# Patient Record
Sex: Female | Born: 2000 | Hispanic: Yes | Marital: Single | Smoking: Former smoker
Health system: Southern US, Community
[De-identification: ages and names within clinical notes are randomized; demographics above are authoritative.]

## PROBLEM LIST (undated history)

## (undated) ENCOUNTER — Inpatient Hospital Stay (HOSPITAL_COMMUNITY): Payer: Self-pay

## (undated) DIAGNOSIS — O26649 Intrahepatic cholestasis of pregnancy, unspecified trimester: Secondary | ICD-10-CM

## (undated) DIAGNOSIS — O26619 Liver and biliary tract disorders in pregnancy, unspecified trimester: Secondary | ICD-10-CM

## (undated) DIAGNOSIS — K831 Obstruction of bile duct: Secondary | ICD-10-CM

## (undated) HISTORY — DX: Intrahepatic cholestasis of pregnancy, unspecified trimester: O26.649

## (undated) HISTORY — PX: NO PAST SURGERIES: SHX2092

## (undated) HISTORY — DX: Liver and biliary tract disorders in pregnancy, unspecified trimester: K83.1

## (undated) HISTORY — DX: Liver and biliary tract disorders in pregnancy, unspecified trimester: O26.619

---
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
## 2021-11-22 IMAGING — US US MFM FETAL BPP W/O NON-STRESS
1 series · 15 of 22 positions shown · non-contrast
Comparison: none

[Series 1: us mfm fetal bpp w/o non-stress · 22 acquisitions, 15 frames shown]
[im 1/22]
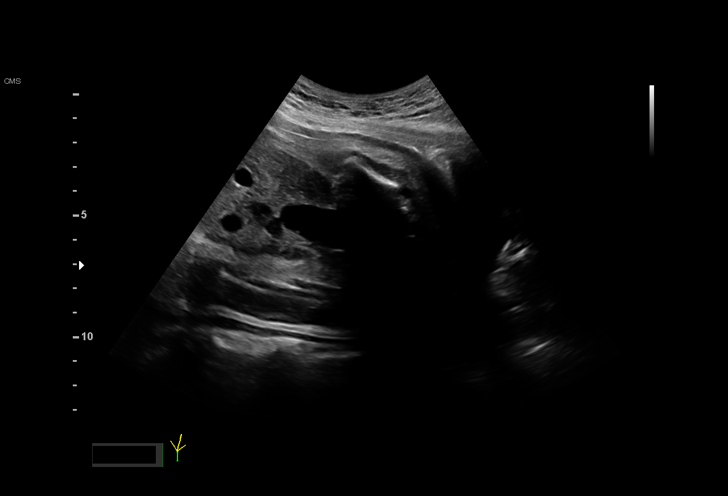
[im 3/22]
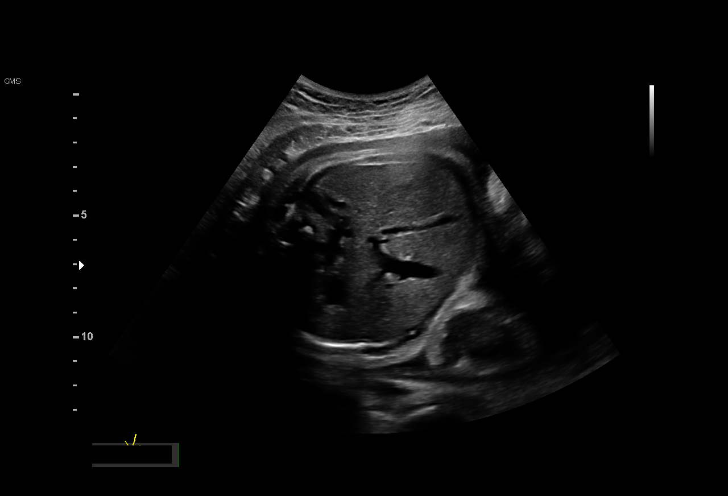
[im 4/22]
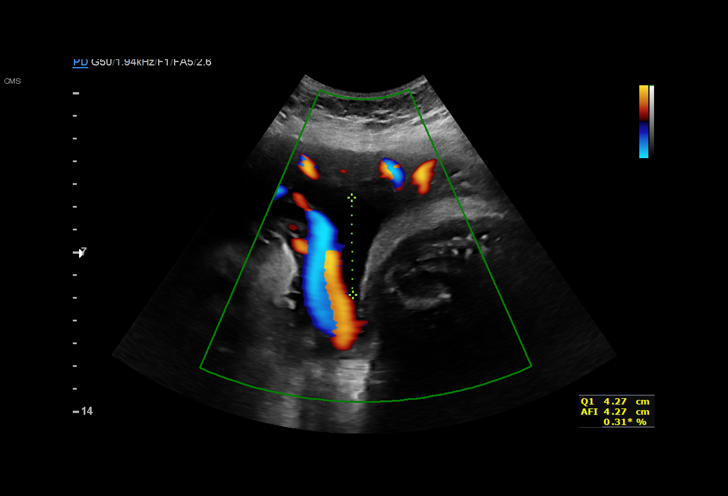
[im 6/22]
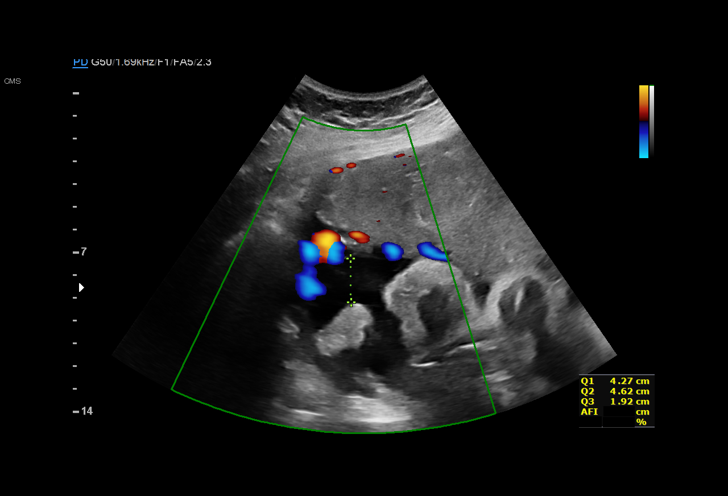
[im 7/22]
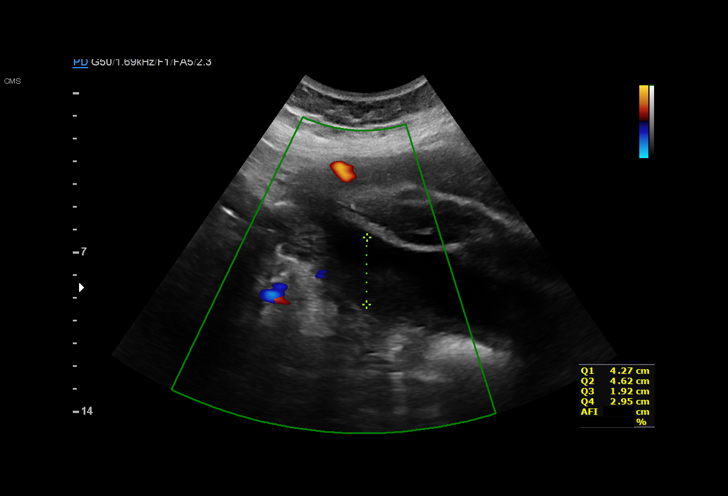
[im 9/22]
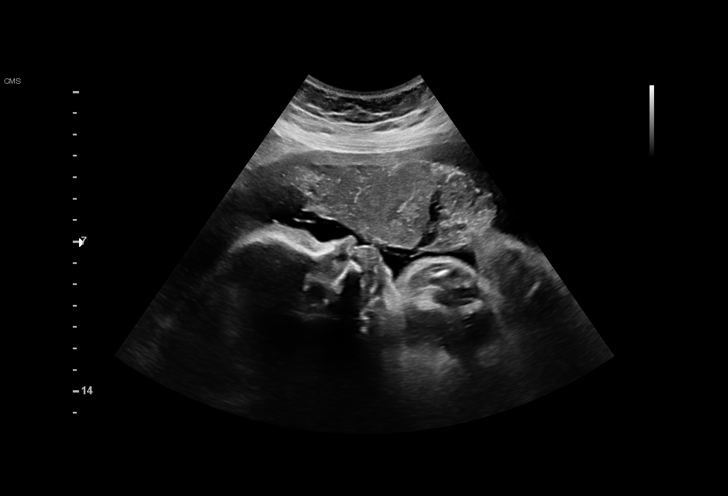
[im 10/22]
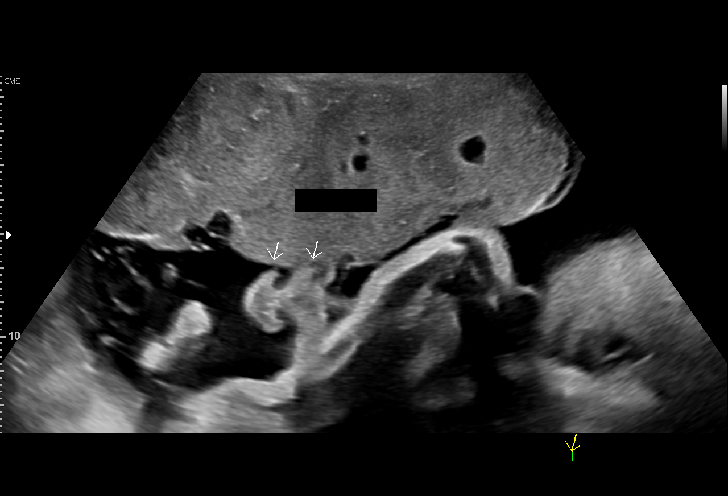
[im 12/22]
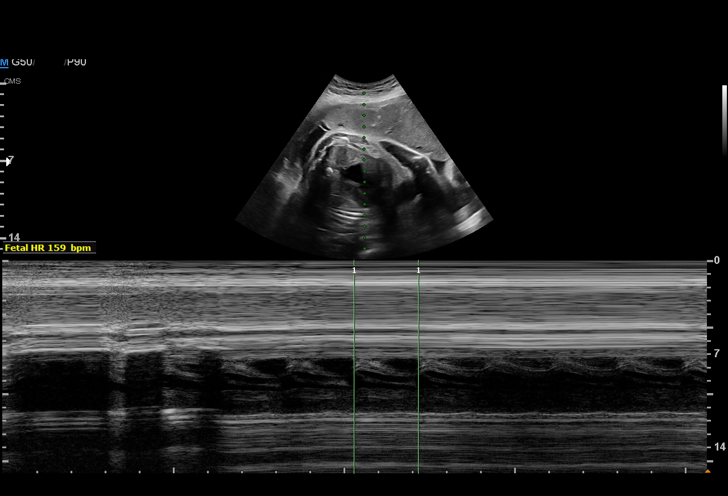
[im 13/22]
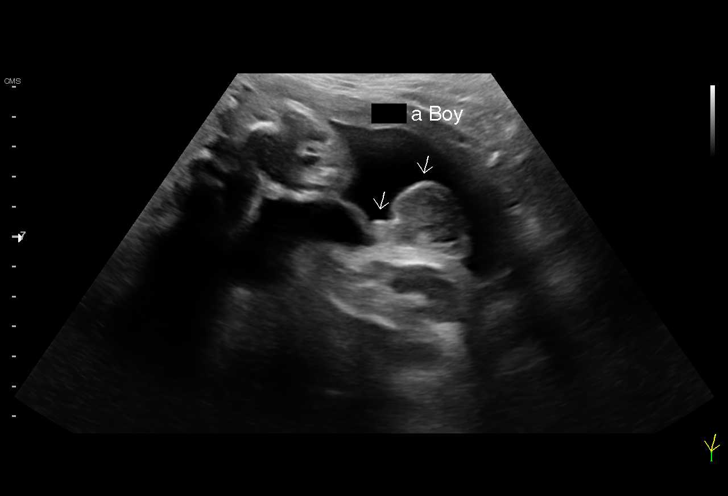
[im 14/22]
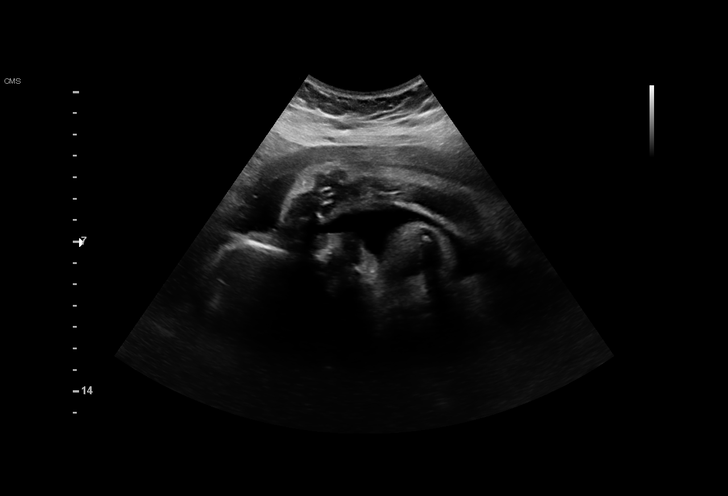
[im 16/22]
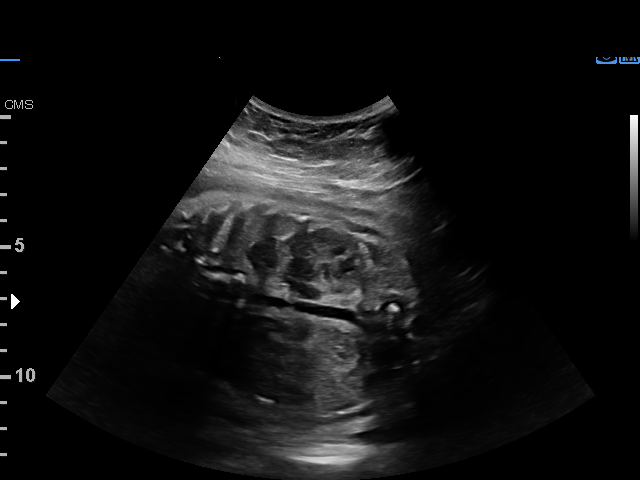
[im 17/22]
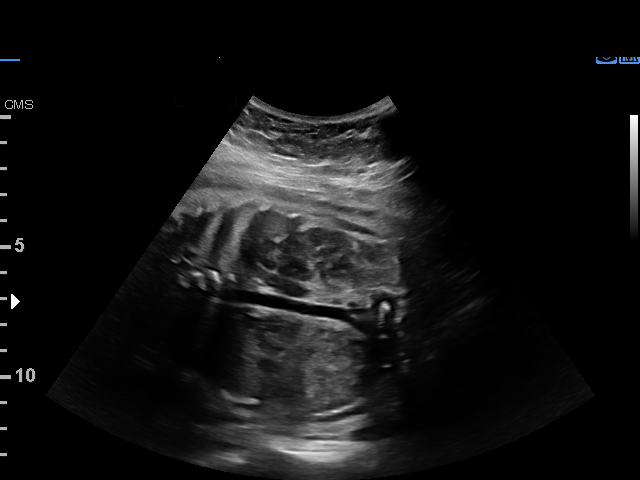
[im 19/22]
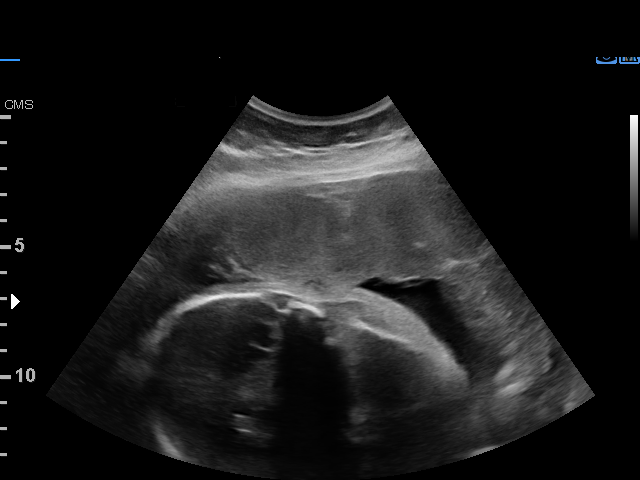
[im 20/22]
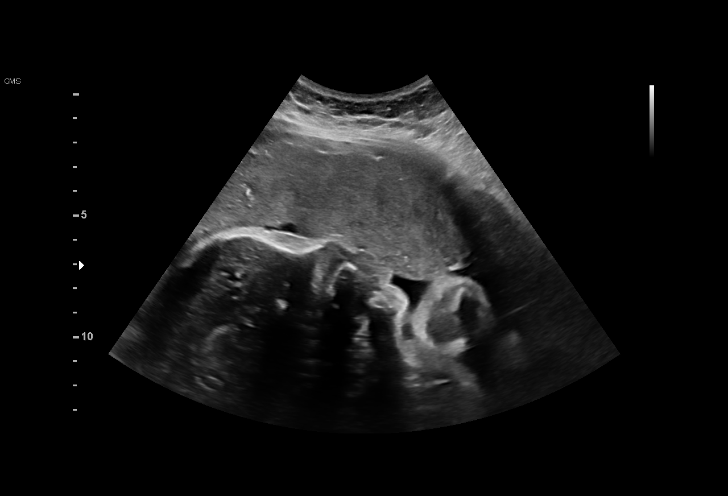
[im 22/22]
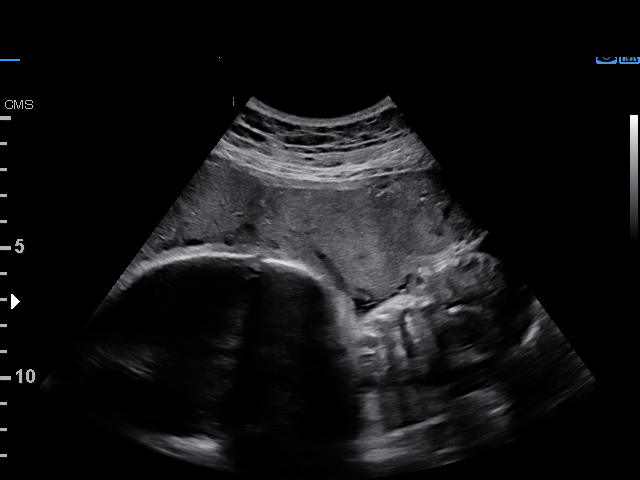

[15 of 22 positions shown; findings below may reference images not displayed]

----------------------------------------------------------------------

 ----------------------------------------------------------------------
Indications

  Cholestasis of pregnancy, third trimester      [id number].5
  Encounter for other antenatal screening
  follow-up
  33 weeks gestation of pregnancy
 ----------------------------------------------------------------------
Fetal Evaluation

 Num Of Fetuses:          1
 Fetal Heart Rate(bpm):   159
 Cardiac Activity:        Observed
 Presentation:            Breech

 Amniotic Fluid
 AFI FV:      Within normal limits

 AFI Sum(cm)     %Tile       Largest Pocket(cm)
 13.76           46

 RUQ(cm)       RLQ(cm)       LUQ(cm)        LLQ(cm)

Biophysical Evaluation

 Amniotic F.V:   Within normal limits       F. Tone:         Observed
 F. Movement:    Observed                   Score:           [DATE]
 F. Breathing:   Observed
OB History
 Blood Type:    O+
 Gravidity:    1         Term:   0        Prem:   0        SAB:   0
 TOP:          0       Ectopic:  0        Living: 0
Gestational Age

 LMP:           33w 0d        Date:  02/02/19                 EDD:   11/09/19
 Best:          33w 0d     Det. By:  LMP  (02/02/19)          EDD:   11/09/19
Impression

 Biophysical profile for known cholestasis in pregnancy
 good fetal movement and amniotic fluid
Recommendations

 Continue weekly testing
 Repeat growth 3-4 weeks
 Delivery by 37 weeks.

## 2021-12-19 IMAGING — US US MFM FETAL BPP W/O NON-STRESS
1 series · 15 of 28 positions shown · non-contrast
Comparison: none

[Series 1: us mfm fetal bpp w/o non-stress · 30 acquisitions, 15 frames shown]
[im 1/30]
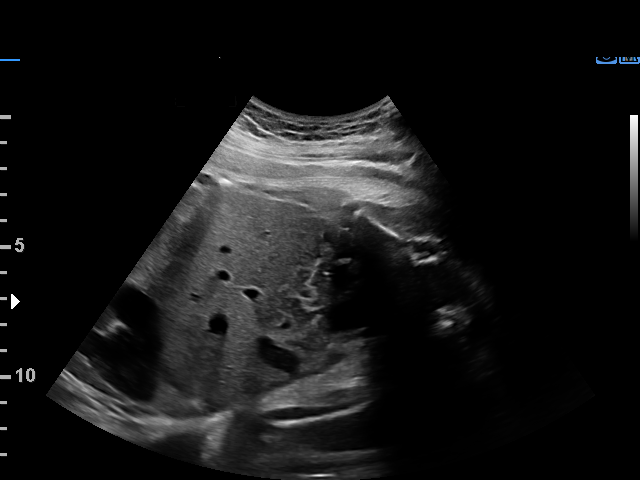
[im 3/30]
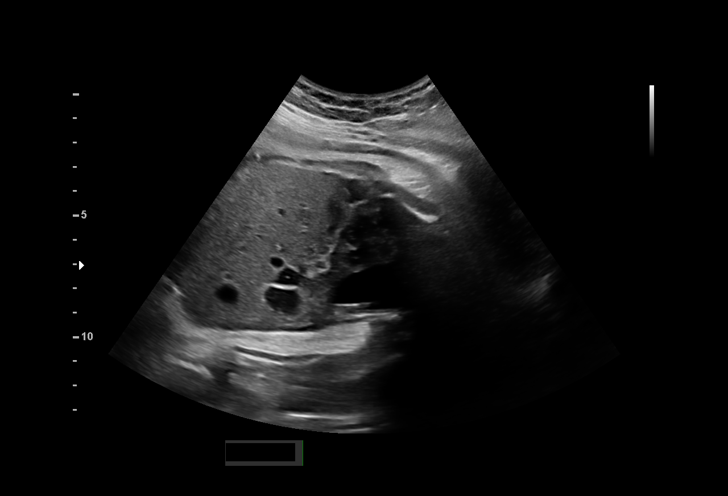
[im 5/30]
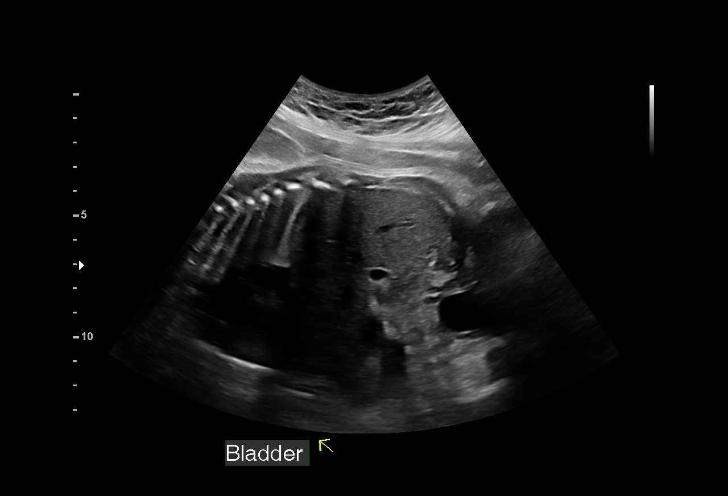
[im 7/30]
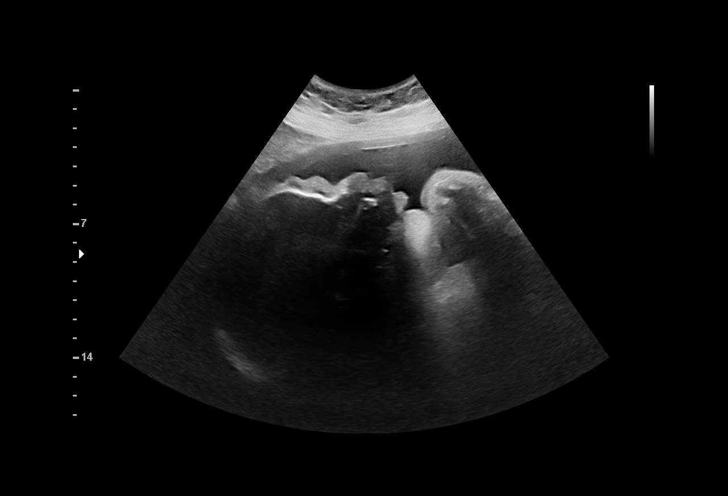
[im 9/30]
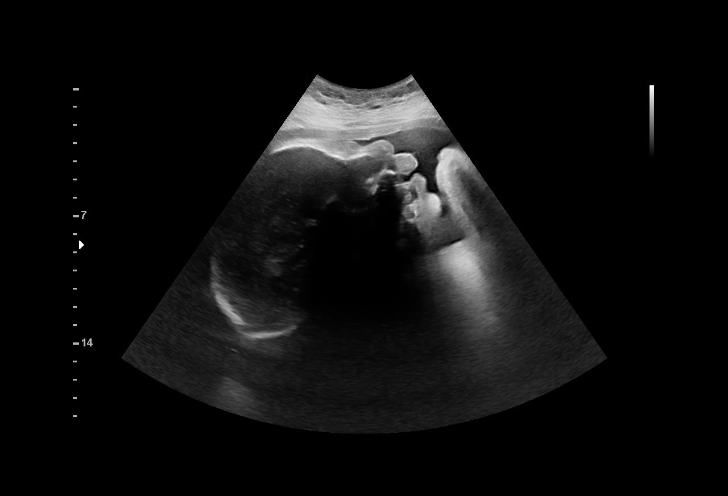
[im 11/30]
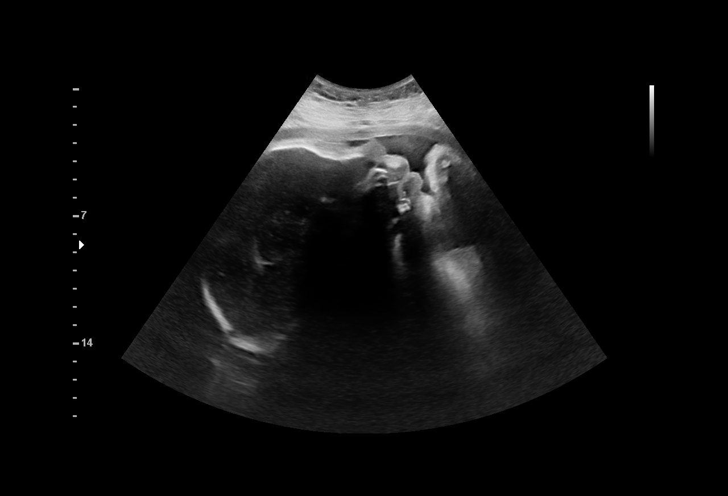
[im 13/30]
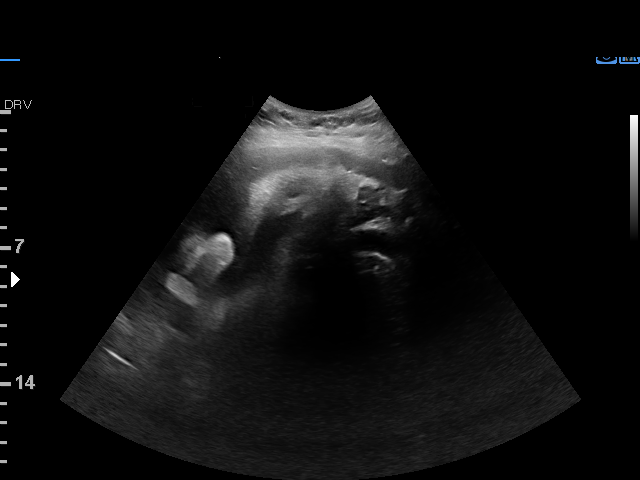
[im 16/30]
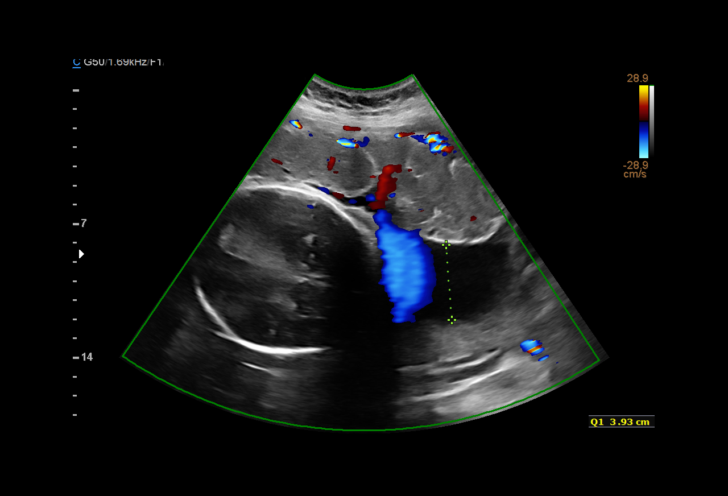
[im 17/30]
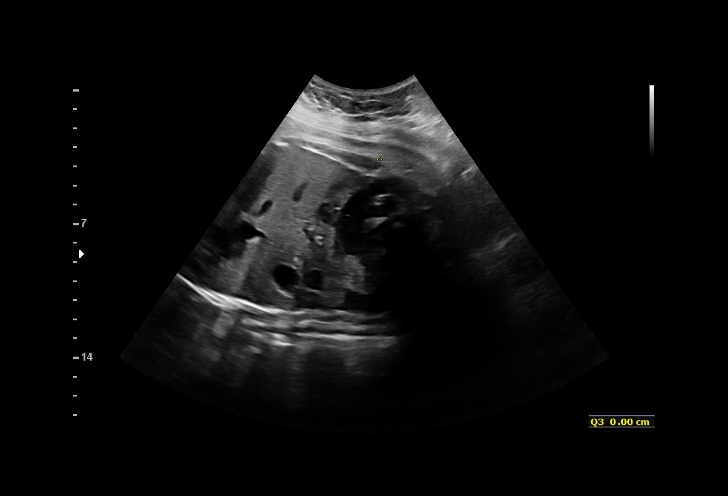
[im 19/30]
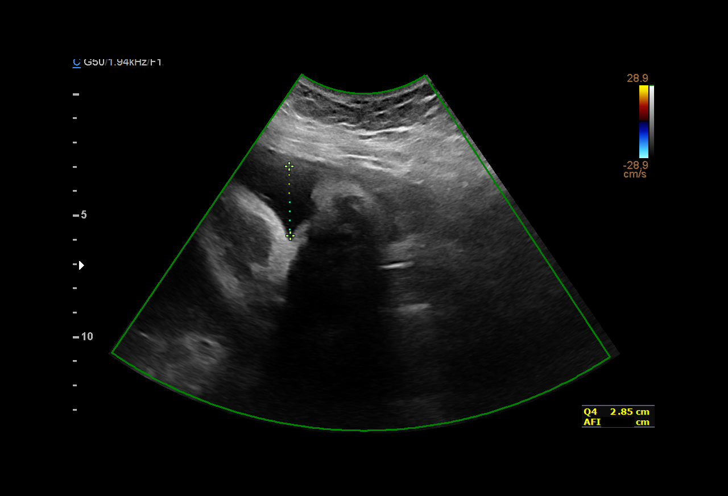
[im 21/30]
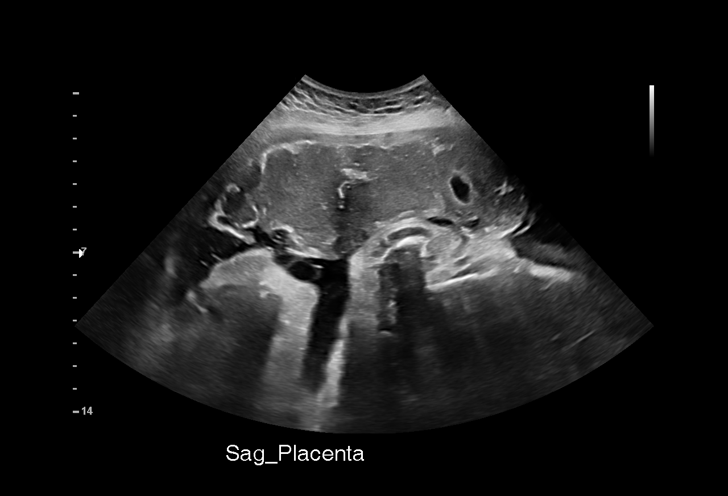
[im 23/30]
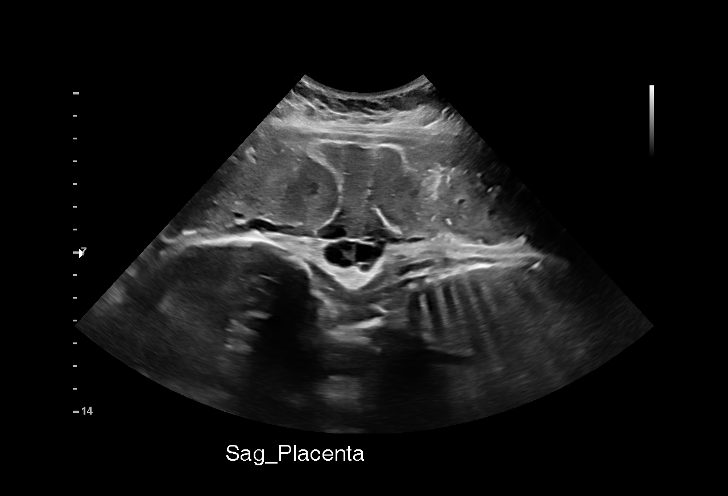
[im 25/30]
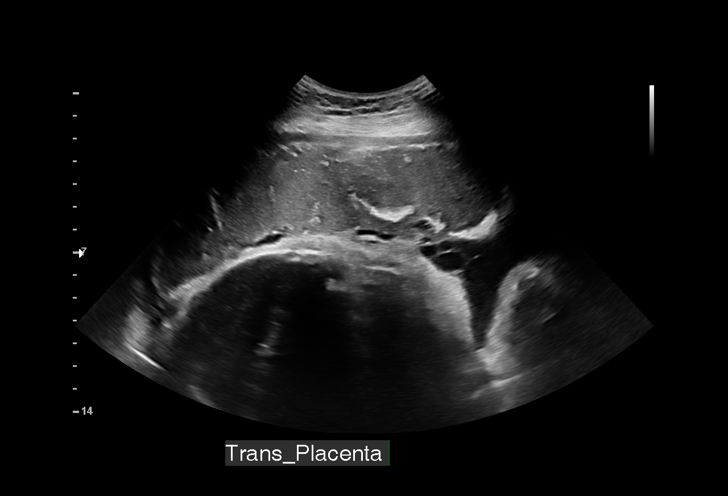
[im 27/30]
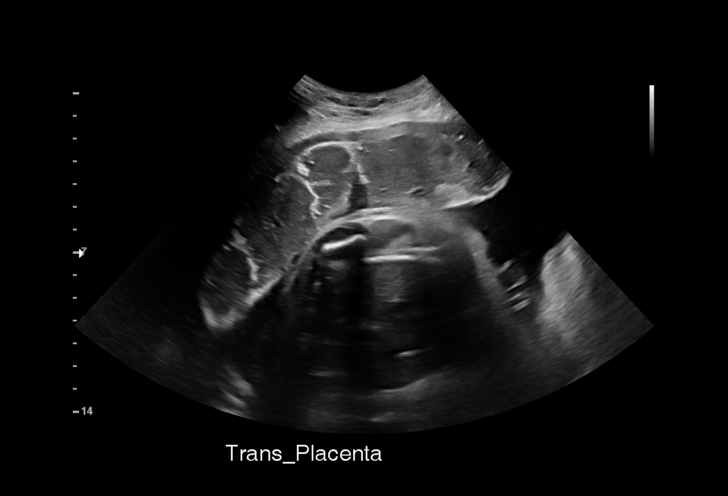
[im 30/30]
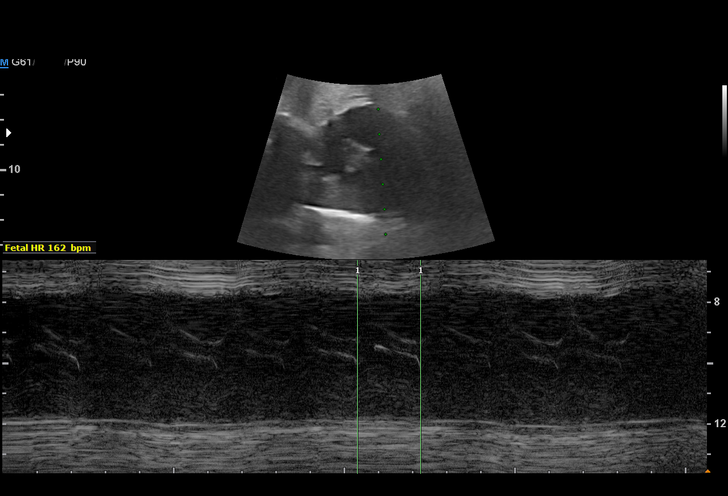

[15 of 28 positions shown; findings below may reference images not displayed]

----------------------------------------------------------------------

 ----------------------------------------------------------------------
Indications

  Cholestasis of pregnancy, third trimester      [id number]
  36 weeks gestation of pregnancy
 ----------------------------------------------------------------------
Fetal Evaluation

 Num Of Fetuses:          1
 Fetal Heart Rate(bpm):   162
 Cardiac Activity:        Observed
 Presentation:            Breech
 Placenta:                Anterior
 P. Cord Insertion:       Not well visualized

 Amniotic Fluid
 AFI FV:      Within normal limits

 AFI Sum(cm)     %Tile       Largest Pocket(cm)
 11.68           36

 RUQ(cm)       RLQ(cm)       LUQ(cm)        LLQ(cm)
 3.93          2.85          4.9            0
Biophysical Evaluation

 Amniotic F.V:   Within normal limits       F. Tone:         Observed
 F. Movement:    Observed                   Score:           [DATE]
 F. Breathing:   Observed
OB History
 Blood Type:    O+
 Gravidity:    1         Term:   0        Prem:   0        SAB:   0
 TOP:          0       Ectopic:  0        Living: 0
Gestational Age

 LMP:           36w 6d        Date:  02/02/19                 EDD:   11/09/19
 Best:          36w 6d     Det. By:  LMP  (02/02/19)          EDD:   11/09/19
Cervix Uterus Adnexa

 Cervix
 Not visualized (advanced GA >27wks)
Impression

 Cholestasis in pregnancy
 Biophysical profile [DATE]
Recommendations

 Consider delivery by 37 weeks.

## 2024-04-15 ENCOUNTER — Encounter (HOSPITAL_COMMUNITY): Payer: Self-pay | Admitting: *Deleted

## 2024-04-15 ENCOUNTER — Inpatient Hospital Stay (HOSPITAL_COMMUNITY)
Admission: AD | Admit: 2024-04-15 | Discharge: 2024-04-15 | Disposition: A | Discharge status: Home / Self Care | Payer: Self-pay | Attending: Obstetrics & Gynecology | Admitting: Obstetrics & Gynecology

## 2024-04-15 DIAGNOSIS — L309 Dermatitis, unspecified: Secondary | ICD-10-CM

## 2024-04-15 DIAGNOSIS — O09291 Supervision of pregnancy with other poor reproductive or obstetric history, first trimester: Secondary | ICD-10-CM | POA: Insufficient documentation

## 2024-04-15 DIAGNOSIS — L299 Pruritus, unspecified: Secondary | ICD-10-CM

## 2024-04-15 DIAGNOSIS — O99711 Diseases of the skin and subcutaneous tissue complicating pregnancy, first trimester: Secondary | ICD-10-CM | POA: Insufficient documentation

## 2024-04-15 DIAGNOSIS — O26891 Other specified pregnancy related conditions, first trimester: Secondary | ICD-10-CM

## 2024-04-15 DIAGNOSIS — Z3A13 13 weeks gestation of pregnancy: Secondary | ICD-10-CM

## 2024-04-15 LAB — POCT PREGNANCY, URINE: Preg Test, Ur: POSITIVE — AB

## 2024-04-15 NOTE — MAU Note (Signed)
 Claire Thornton is a 23 y.o. at [redacted]w[redacted]d here in MAU reporting: c/o itching all over 2-3 days. Had cholestasis with last pregnancy.   Also c/o diarrhea yesterday and today.  LMP:  Onset of complaint: 2-3 days Pain score: 0 Vitals:   04/15/24 1614  BP: 123/74  Pulse: 82  Resp: 18  Temp: 98.7 F (37.1 C)     FHT: 180  Lab orders placed from triage:

## 2024-04-15 NOTE — MAU Provider Note (Addendum)
 Itching    S Claire Thornton is a 23 y.o. G2P1001 female at [redacted]w[redacted]d who presents to MAU today with complaint of itching all over for 2-3 days. She is concerned that this could be cholestasis and she had that near the end of her last pregnancy. She has not tried anything at home for her itching. She broke the skin on her left lower extremity from scratching.  Patient is working on setting up prenatal care with [facility].    Pertinent items noted in HPI and remainder of comprehensive ROS otherwise negative.   O BP 123/74   Pulse 82   Temp 98.7 F (37.1 C)   Resp 18   Ht 4' 11 (1.499 m)   Wt 51.7 kg   LMP 01/12/2024   BMI 23.03 kg/m  Physical Exam General: well-appearing female, NAD HEENT: atraumatic, normocephalic. Neck has normal ROM. EOM grossly intact.  Cardiovascular: warm, well perfused Respiratory: chest rise equal with breaths, normal work of breathing on room air  MSK: no edema of extremities, L lower extremity with well healing scab. Normal ROM grossly Neurologic: alert and oriented x 4. Psychiatric: normal mood. Speech not slurred, not rapid/pressured. Patient is cooperative.    Results for orders placed or performed during the hospital encounter of 04/15/24 (from the past 24 hours)  Pregnancy, urine POC     Status: Abnormal   Collection Time: 04/15/24  4:06 PM  Result Value Ref Range   Preg Test, Ur POSITIVE (A) NEGATIVE    MDM: Straightforward  MAU Course: Patient presented for itching with concern for cholestasis. Patient counseled that cholestasis is a problem that occurs later in pregnancy, but pregnancy can cause and/or exacerbate many other skin conditions. Patient advised to take oral antihistamine. List of medications safe in pregnancy given. Patient sent home in stable condition, return precautions given.    Assessment and Plan Dermatitis 13 weeks of pregnancy - List of safe medications in pregnancy were given - Recommended an  anti-histamine such as Claritin or Benadryl  (or generics) - Advised patient that Benadryl  should only be used at night due to drowsiness  - Patient counseled on cholestasis in pregnancy, explained that it occurs later in pregnancy    Medical screening exam complete  Discharge from MAU in stable condition with return precautions. Follow up at [facility] as scheduled for ongoing prenatal care.  No future appointments. Allergies as of 04/15/2024   No Known Allergies      Medication List     STOP taking these medications    ibuprofen  800 MG tablet Commonly known as: ADVIL    nystatin  cream Commonly known as: MYCOSTATIN    oxyCODONE  5 MG immediate release tablet Commonly known as: Oxy IR/ROXICODONE    triamcinolone  cream 0.1 % Commonly known as: KENALOG        TAKE these medications    acetaminophen  325 MG tablet Commonly known as: TYLENOL  Take 2 tablets (650 mg total) by mouth every 6 (six) hours as needed for mild pain (temperature > 101.5.).   multivitamin-prenatal 27-0.8 MG Tabs tablet Take 1 tablet by mouth daily at 12 noon.        Raguel KANDICE Lee, DO  Attestation of Supervision of Student:  I confirm that I have verified the information documented in the  [facility] [employer]  note and that I have also personally reperformed the history, physical exam and all medical decision making activities.  I have verified that all services and findings are accurately documented in  this student's note; and I agree with management and plan as outlined in the documentation. I have also made any necessary editorial changes.   I personally confirmed the HPI and Physical exam and agree with the A/P as written above  Olam DELENA Dalton, NP [facility] [employer], [facility] 04/15/2024 7:37 PM

## 2024-04-15 NOTE — Discharge Instructions (Addendum)
 We gave you a list of medications that are safe to take in pregnancy. Recommend you take an anti-histamine such a Claritin or benadryl  (you can take the generic) for itching. Benadryl  can make you sleepy, so if you take that medication, recommend you take it at night.

## 2024-04-28 ENCOUNTER — Other Ambulatory Visit: Payer: Self-pay

## 2024-04-28 DIAGNOSIS — Z36 Encounter for antenatal screening for chromosomal anomalies: Secondary | ICD-10-CM

## 2024-06-03 DIAGNOSIS — O99320 Drug use complicating pregnancy, unspecified trimester: Secondary | ICD-10-CM | POA: Insufficient documentation

## 2024-06-09 ENCOUNTER — Other Ambulatory Visit: Payer: Self-pay | Admitting: *Deleted

## 2024-06-09 ENCOUNTER — Ambulatory Visit (HOSPITAL_BASED_OUTPATIENT_CLINIC_OR_DEPARTMENT_OTHER): Payer: Self-pay | Admitting: Obstetrics

## 2024-06-09 ENCOUNTER — Ambulatory Visit: Payer: Self-pay

## 2024-06-09 VITALS — BP 100/55 | HR 64

## 2024-06-09 DIAGNOSIS — Z3492 Encounter for supervision of normal pregnancy, unspecified, second trimester: Secondary | ICD-10-CM

## 2024-06-09 DIAGNOSIS — O358XX0 Maternal care for other (suspected) fetal abnormality and damage, not applicable or unspecified: Secondary | ICD-10-CM

## 2024-06-09 DIAGNOSIS — Z3A17 17 weeks gestation of pregnancy: Secondary | ICD-10-CM

## 2024-06-09 DIAGNOSIS — Z3A21 21 weeks gestation of pregnancy: Secondary | ICD-10-CM | POA: Insufficient documentation

## 2024-06-09 DIAGNOSIS — O34219 Maternal care for unspecified type scar from previous cesarean delivery: Secondary | ICD-10-CM

## 2024-06-09 DIAGNOSIS — O99320 Drug use complicating pregnancy, unspecified trimester: Secondary | ICD-10-CM | POA: Insufficient documentation

## 2024-06-09 DIAGNOSIS — F1290 Cannabis use, unspecified, uncomplicated: Secondary | ICD-10-CM

## 2024-06-09 DIAGNOSIS — O99322 Drug use complicating pregnancy, second trimester: Secondary | ICD-10-CM

## 2024-06-09 DIAGNOSIS — Z362 Encounter for other antenatal screening follow-up: Secondary | ICD-10-CM

## 2024-06-09 DIAGNOSIS — Z36 Encounter for antenatal screening for chromosomal anomalies: Secondary | ICD-10-CM | POA: Insufficient documentation

## 2024-06-09 NOTE — Progress Notes (Signed)
 MFM Consult Note  Claire Thornton was seen for a detailed fetal anatomy scan.  This is the first ultrasound that she has had in her current pregnancy.  Her last pregnancy in 2021 was complicated by cholestasis of pregnancy.    She denies any problems in her current pregnancy.    She had a cell free DNA test earlier in her pregnancy which indicated a low risk for trisomy 4, 69, and 13. A female fetus is predicted.   Sonographic findings Single intrauterine pregnancy Fetal cardiac activity:  Observed and appears normal. Presentation: Transverse, head to maternal left. Based on the fetal biometry measurements obtained today, her EDC was changed to November 13, 2024, making her 17 weeks and 4 days pregnant today. The views of the fetal anatomy were limited today due to her early gestational age.  What was visualized today appeared within normal limits. Amniotic fluid volume: Within normal limits.  MVP: 4.58 cm. Placenta: Posterior.  The patient was informed that anomalies may be missed due to technical limitations. If the fetus is in a suboptimal position or maternal habitus is increased, visualization of the fetus in the maternal uterus may be impaired.  History of cholestasis in prior pregnancy The patient denies any symptoms of cholestasis.   She should have bile acids drawn should she complain of any signs or symptoms of cholestasis.  A follow-up exam was scheduled in 4 weeks to confirm her dates and to reassess the views of the fetal anatomy.  The patient stated that all of her questions were answered today.    A total of 30 minutes was spent counseling, reviewing her chart, and coordinating the care for this patient.  Greater than 50% of the time was spent in direct face-to-face contact.

## 2024-07-20 ENCOUNTER — Ambulatory Visit: Payer: Self-pay | Admitting: Obstetrics and Gynecology

## 2024-07-20 ENCOUNTER — Ambulatory Visit: Payer: Self-pay

## 2024-07-20 ENCOUNTER — Other Ambulatory Visit: Payer: Self-pay | Admitting: *Deleted

## 2024-07-20 VITALS — BP 110/53 | HR 70

## 2024-07-20 DIAGNOSIS — O43192 Other malformation of placenta, second trimester: Secondary | ICD-10-CM | POA: Insufficient documentation

## 2024-07-20 DIAGNOSIS — O34219 Maternal care for unspecified type scar from previous cesarean delivery: Secondary | ICD-10-CM

## 2024-07-20 DIAGNOSIS — O26643 Intrahepatic cholestasis of pregnancy, third trimester: Secondary | ICD-10-CM

## 2024-07-20 DIAGNOSIS — O99332 Smoking (tobacco) complicating pregnancy, second trimester: Secondary | ICD-10-CM

## 2024-07-20 DIAGNOSIS — Z3A23 23 weeks gestation of pregnancy: Secondary | ICD-10-CM

## 2024-07-20 DIAGNOSIS — Z362 Encounter for other antenatal screening follow-up: Secondary | ICD-10-CM | POA: Insufficient documentation

## 2024-07-20 DIAGNOSIS — O99322 Drug use complicating pregnancy, second trimester: Secondary | ICD-10-CM | POA: Insufficient documentation

## 2024-07-20 DIAGNOSIS — Z3492 Encounter for supervision of normal pregnancy, unspecified, second trimester: Secondary | ICD-10-CM | POA: Insufficient documentation

## 2024-07-20 DIAGNOSIS — O43199 Other malformation of placenta, unspecified trimester: Secondary | ICD-10-CM

## 2024-07-20 NOTE — Progress Notes (Signed)
 Maternal-Fetal Medicine Consultation  Name: Claire Thornton  MRN: [id number]  GA: [id number] [redacted]w[redacted]d   Patient is here for completion of fetal anatomy.  She had the following problems: - History of cesarean delivery for breech presentation. No uterine anomalies were mentioned in the operative note. - History of cholestasis.  Her previous pregnancy was complicated by cholestasis and she delivered at [redacted] weeks gestation.  Patient complains of itching on the inner thighs and back of forearms.  Recent bile acid levels are pending.  Ultrasound Normal fetal growth and amniotic fluid.  Fetal anatomical survey was completed and appears normal.  Placenta is posterior and there is no evidence of previa or placenta accreta spectrum. Marginal cord insertion is seen.  Previous cesarean delivery I reassured the patient of normal placental location and that there is no evidence of previa or placenta accreta spectrum.  I discussed the benefits and risks of vaginal birth after cesarean section (VBAC).  The risk of uterine scar dehiscence is about 1%.  Cesarean deliveries increase the risks of hemorrhage, infection and venous thromboembolism.  Repeat cesarean deliveries increase the risks of placenta previa and/or placenta accreta spectrum. Long-term complications include small bowel obstruction.  Based on [facility] calculator, the likelihood of successful vaginal delivery is 65%. Patient was counseled that this is only an approximate calculation.  Patient will discuss with her provider and decide on mode of delivery.  Marginal cord insertion I explained the finding of marginal cord insertion with the help of ultrasound images and diagrams.  Marginal cord insertion can be associated with fetal growth restriction and be recommend fetal growth assessments in this pregnancy.  Very rarely, it can be associated with placental abruption.  History of cholestasis Patient is aware that cholestasis is  more common in Hispanic population.  Recurrence of cholestasis seen up to 90% of cases.  Bile acid results should be followed.  Patient reports she was prescribed ursodiol  medications.  I advised her to take ursodiol  only if bile acid levels are increased.  We will counsel on cholestasis at her next visit if it is confirmed.  Recommendations -An appointment was made for her to return in 9 weeks for fetal growth assessment. - Weekly antenatal testing from 28- to 32-weeks' gestation if cholestasis is confirmed.    Consultation including face-to-face (more than 50%) counseling 30 minutes.

## 2024-08-18 ENCOUNTER — Ambulatory Visit
Admission: RE | Admit: 2024-08-18 | Discharge: 2024-08-18 | Disposition: A | Discharge status: Home / Self Care | Payer: Self-pay | Source: Ambulatory Visit

## 2024-08-18 VITALS — BP 102/66 | HR 80 | Temp 97.8°F | Resp 16

## 2024-08-18 DIAGNOSIS — H0014 Chalazion left upper eyelid: Secondary | ICD-10-CM

## 2024-08-18 NOTE — ED Provider Notes (Signed)
 " [facility]    CSN: [id number] Arrival date & time: 08/18/24  1006      History   Chief Complaint Chief Complaint  Patient presents with   Eye Problem    HPI Claire Thornton is a 24 y.o. female.   Patient presents today with bump to left upper eyelid ongoing for the past 2 months.  Reports when it first started, it was a little bit painful and a little bit red.  Reports it itches from time to time.  No drainage from the eye, redness of the eye, vision changes, blurred vision, double vision, or headaches.  She has tried warm compresses intermittently without relief.  Patient is currently approximately [redacted] weeks pregnant.    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   Delivery of pregnancy by cesarean section 10/22/2019   [redacted] weeks gestation of pregnancy 10/20/2019   Breech presentation 10/17/2019   Cholestasis of pregnancy in third trimester 09/12/2019   Supervision of high-risk pregnancy 04/25/2019   At risk for hypertension 04/25/2019    Past Surgical History:  Procedure Laterality Date   CESAREAN SECTION N/A 10/20/2019   Procedure: CESAREAN SECTION;  Surgeon: Eveline Lynwood MATSU, MD;  Location: [facility] LD ORS;  Service: Obstetrics;  Laterality: N/A;    OB History     Gravida  3   Para  1   Term  1   Preterm      AB  1   Living  1      SAB  1   IAB      Ectopic      Multiple  0   Live Births  1            Home Medications    Prior to Admission medications  Medication Sig Start Date End Date Taking? Authorizing Provider  Prenatal Vit-Fe Fumarate-FA (MULTIVITAMIN-PRENATAL) 27-0.8 MG TABS tablet Take 1 tablet by mouth daily at 12 noon.    [provider]    Family History Family History  Problem Relation Age of Onset   Diabetes Mother     Diabetes Father     Social History Social History[1]   Allergies   Patient has no known allergies.   Review of Systems Review of Systems Per HPI  Physical Exam Triage Vital Signs ED Triage Vitals [08/18/24 1020]  Encounter Vitals Group     BP 102/66     Girls Systolic BP Percentile      Girls Diastolic BP Percentile      Boys Systolic BP Percentile      Boys Diastolic BP Percentile      Pulse Rate 80     Resp 16     Temp 97.8 F (36.6 C)     Temp Source Oral     SpO2 97 %     Weight      Height      Head Circumference      Peak Flow      Pain Score      Pain Loc      Pain Education      Exclude from Growth Chart    No data found.  Updated Vital Signs BP 102/66 (BP Location: Left Arm)   Pulse 80   Temp 97.8 F (36.6 C) (Oral)  Resp 16   LMP 01/12/2024   SpO2 97%   Breastfeeding No   Visual Acuity Right Eye Distance:   Left Eye Distance:   Bilateral Distance:    Right Eye Near:   Left Eye Near:    Bilateral Near:     Physical Exam Vitals and nursing note reviewed.  Constitutional:      General: She is not in acute distress.    Appearance: Normal appearance. She is not toxic-appearing.  HENT:     Right Ear: External ear normal.     Left Ear: External ear normal.     Mouth/Throat:     Mouth: Mucous membranes are moist.     Pharynx: Oropharynx is clear.  Eyes:     General: No scleral icterus.       Right eye: No discharge.        Left eye: No discharge.     Extraocular Movements: Extraocular movements intact.     Conjunctiva/sclera:     Right eye: Right conjunctiva is not injected. No chemosis, exudate or hemorrhage.    Left eye: Left conjunctiva is not injected. No chemosis, exudate or hemorrhage.    Pupils: Pupils are equal, round, and reactive to light.     Comments: Chalazion left upper eyelid  Cardiovascular:     Rate and Rhythm: Normal rate.  Pulmonary:     Effort: Pulmonary effort is normal. No respiratory distress.   Musculoskeletal:     Cervical back: Normal range of motion.  Lymphadenopathy:     Cervical: No cervical adenopathy.  Skin:    General: Skin is warm and dry.     Coloration: Skin is not jaundiced or pale.     Findings: No erythema.  Neurological:     Mental Status: She is alert and oriented to person, place, and time.  Psychiatric:        Behavior: Behavior is cooperative.      UC Treatments / Results  Labs (all labs ordered are listed, but only abnormal results are displayed) Labs Reviewed - No data to display  EKG   Radiology No results found.  Procedures Procedures (including critical care time)  Medications Ordered in UC Medications - No data to display  Initial Impression / Assessment and Plan / UC Course  I have reviewed the triage vital signs and the nursing notes.  Pertinent labs & imaging results that were available during my care of the patient were reviewed by me and considered in my medical decision making (see chart for details).   Patient is a very pleasant, well-appearing 24 year old female presenting today for bump on the left eyelid.  Vital signs are stable; no red flags in history or on exam today.  On exam, she has a chalazion of the left upper eyelid.  Supportive care discussed.  Recommended follow-up with ophthalmology if symptoms do not fully improve with warm compresses.  Return and ER precautions discussed.  The patient was given the opportunity to ask questions.  All questions answered to their satisfaction.  The patient is in agreement to this plan.   Final Clinical Impressions(s) / UC Diagnoses   Final diagnoses:  Chalazion left upper eyelid     Discharge Instructions      The bump on your eyelid is likely a blocked oil gland.  Continue warm compresses.  If it does not fully resolve, recommend calling ophthalmology-contact information has been provided.    ED Prescriptions   None    PDMP not reviewed  this encounter.    [1]   Social History Tobacco Use   Smoking status: Former    Types: E-cigarettes   Smokeless tobacco: Never  Vaping Use   Vaping status: Former   Substances: Nicotine, THC, Flavoring  Substance Use Topics   Alcohol use: Not Currently   Drug use: Not Currently    Types: Marijuana    Comment: last smoked beginning 2020     Chandra Harlene LABOR, NP 08/18/24 1057  "

## 2024-08-18 NOTE — ED Triage Notes (Signed)
 Pt has c/o having a lump over top over left eye x 2 months. Pt states she thought it was a stye,but  it hasn't gone away since having it, and is having some itching from the eye.

## 2024-08-18 NOTE — Discharge Instructions (Addendum)
 The bump on your eyelid is likely a blocked oil gland.  Continue warm compresses.  If it does not fully resolve, recommend calling ophthalmology-contact information has been provided.

## 2024-09-21 ENCOUNTER — Ambulatory Visit: Payer: Self-pay
